# Patient Record
Sex: Female | Born: 1995 | Race: White | Hispanic: Yes | Marital: Single | Smoking: Current every day smoker
Health system: Southern US, Community
[De-identification: ages and names within clinical notes are randomized; demographics above are authoritative.]

---
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
## 2013-05-30 ENCOUNTER — Emergency Department: Payer: Self-pay | Admitting: Emergency Medicine

## 2013-12-12 ENCOUNTER — Emergency Department: Payer: Self-pay | Admitting: Emergency Medicine

## 2013-12-12 LAB — DRUG SCREEN, URINE
Amphetamines, Ur Screen: POSITIVE (ref ?–1000)
BENZODIAZEPINE, UR SCRN: POSITIVE (ref ?–200)
Barbiturates, Ur Screen: NEGATIVE (ref ?–200)
CANNABINOID 50 NG, UR ~~LOC~~: POSITIVE (ref ?–50)
COCAINE METABOLITE, UR ~~LOC~~: POSITIVE (ref ?–300)
MDMA (ECSTASY) UR SCREEN: NEGATIVE (ref ?–500)
METHADONE, UR SCREEN: POSITIVE (ref ?–300)
Opiate, Ur Screen: NEGATIVE (ref ?–300)
Phencyclidine (PCP) Ur S: NEGATIVE (ref ?–25)
Tricyclic, Ur Screen: NEGATIVE (ref ?–1000)

## 2013-12-12 LAB — COMPREHENSIVE METABOLIC PANEL
ALBUMIN: 4 g/dL (ref 3.8–5.6)
Alkaline Phosphatase: 88 U/L
Anion Gap: 8 (ref 7–16)
BILIRUBIN TOTAL: 0.5 mg/dL (ref 0.2–1.0)
BUN: 9 mg/dL (ref 7–18)
CALCIUM: 8.8 mg/dL — AB (ref 9.0–10.7)
CHLORIDE: 105 mmol/L (ref 98–107)
CO2: 24 mmol/L (ref 21–32)
Creatinine: 1.17 mg/dL (ref 0.60–1.30)
EGFR (African American): >60
EGFR (Non-African Amer.): >60
Glucose: 100 mg/dL — ABNORMAL HIGH (ref 65–99)
OSMOLALITY: 273 (ref 275–301)
POTASSIUM: 3.3 mmol/L — AB (ref 3.5–5.1)
SGOT(AST): 32 U/L — ABNORMAL HIGH (ref 0–26)
SGPT (ALT): 15 U/L (ref 12–78)
Sodium: 137 mmol/L (ref 136–145)
Total Protein: 8.5 g/dL (ref 6.4–8.6)

## 2013-12-12 LAB — URINALYSIS, COMPLETE
Bacteria: NONE SEEN
GLUCOSE, UR: NEGATIVE mg/dL (ref 0–75)
Ketone: NEGATIVE
Nitrite: NEGATIVE
Ph: 6 (ref 4.5–8.0)
Protein: 100
Specific Gravity: 1.025 (ref 1.003–1.030)
WBC UR: 41 /HPF (ref 0–5)

## 2013-12-12 LAB — CBC
HCT: 41.7 % (ref 35.0–47.0)
HGB: 14 g/dL (ref 12.0–16.0)
MCH: 30.4 pg (ref 26.0–34.0)
MCHC: 33.5 g/dL (ref 32.0–36.0)
MCV: 91 fL (ref 80–100)
PLATELETS: 292 10*3/uL (ref 150–440)
RBC: 4.59 10*6/uL (ref 3.80–5.20)
RDW: 14.4 % (ref 11.5–14.5)
WBC: 9.1 10*3/uL (ref 3.6–11.0)

## 2013-12-12 LAB — SALICYLATE LEVEL: Salicylates, Serum: 2.3 mg/dL

## 2013-12-12 LAB — ETHANOL
Ethanol %: <0.003 % (ref 0.000–0.080)
Ethanol: <3 mg/dL

## 2013-12-12 LAB — ACETAMINOPHEN LEVEL: Acetaminophen: <2 ug/mL

## 2013-12-14 DIAGNOSIS — M545 Low back pain, unspecified: Secondary | ICD-10-CM | POA: Insufficient documentation

## 2013-12-14 DIAGNOSIS — F419 Anxiety disorder, unspecified: Secondary | ICD-10-CM | POA: Insufficient documentation

## 2014-03-24 ENCOUNTER — Emergency Department: Payer: Self-pay | Admitting: Emergency Medicine

## 2014-03-24 LAB — CBC WITH DIFFERENTIAL/PLATELET
BASOS ABS: 0.1 10*3/uL (ref 0.0–0.1)
Basophil %: 0.7 %
Eosinophil #: 0 10*3/uL (ref 0.0–0.7)
Eosinophil %: 0.1 %
HCT: 44.3 % (ref 35.0–47.0)
HGB: 14.6 g/dL (ref 12.0–16.0)
Lymphocyte #: 1.9 10*3/uL (ref 1.0–3.6)
Lymphocyte %: 19.8 %
MCH: 30.8 pg (ref 26.0–34.0)
MCHC: 33 g/dL (ref 32.0–36.0)
MCV: 93 fL (ref 80–100)
MONO ABS: 0.6 x10 3/mm (ref 0.2–0.9)
Monocyte %: 5.9 %
NEUTROS ABS: 7.1 10*3/uL — AB (ref 1.4–6.5)
Neutrophil %: 73.5 %
PLATELETS: 335 10*3/uL (ref 150–440)
RBC: 4.74 10*6/uL (ref 3.80–5.20)
RDW: 13.6 % (ref 11.5–14.5)
WBC: 9.6 10*3/uL (ref 3.6–11.0)

## 2014-03-24 LAB — COMPREHENSIVE METABOLIC PANEL
ALT: 16 U/L
AST: 25 U/L (ref 15–37)
Albumin: 4.1 g/dL (ref 3.4–5.0)
Alkaline Phosphatase: 72 U/L
Anion Gap: 9 (ref 7–16)
BILIRUBIN TOTAL: 0.3 mg/dL (ref 0.2–1.0)
BUN: 10 mg/dL (ref 7–18)
Calcium, Total: 8.7 mg/dL (ref 8.5–10.1)
Chloride: 108 mmol/L — ABNORMAL HIGH (ref 98–107)
Co2: 24 mmol/L (ref 21–32)
Creatinine: 0.94 mg/dL (ref 0.60–1.30)
EGFR (African American): >60
EGFR (Non-African Amer.): >60
Glucose: 97 mg/dL (ref 65–99)
OSMOLALITY: 280 (ref 275–301)
Potassium: 3.6 mmol/L (ref 3.5–5.1)
SODIUM: 141 mmol/L (ref 136–145)
TOTAL PROTEIN: 8.6 g/dL — AB (ref 6.4–8.2)

## 2014-03-24 LAB — URINALYSIS, COMPLETE
BACTERIA: NONE SEEN
BILIRUBIN, UR: NEGATIVE
Blood: NEGATIVE
GLUCOSE, UR: NEGATIVE mg/dL (ref 0–75)
KETONE: NEGATIVE
NITRITE: NEGATIVE
Ph: 7 (ref 4.5–8.0)
Protein: 100
SPECIFIC GRAVITY: 1.029 (ref 1.003–1.030)
WBC UR: 67 /HPF (ref 0–5)

## 2014-03-24 LAB — LIPASE, BLOOD: Lipase: 144 U/L (ref 73–393)

## 2014-06-22 ENCOUNTER — Emergency Department: Payer: Self-pay | Admitting: Emergency Medicine

## 2014-11-09 NOTE — Op Note (Signed)
PATIENT NAME:  Crystal Ellis, [name] MR#:  [id number] DATE OF BIRTH:  10/14/1995  DATE OF PROCEDURE:  12/14/2012  PREOPERATIVE DIAGNOSIS: Symptomatic right inguinal hernia.   POSTOPERATIVE DIAGNOSIS: Incarcerated right indirect inguinal hernia.   PROCEDURE PERFORMED: Laparoscopic transabdominal herniorrhaphy with Physio mesh.   SURGEON: Raynald KempMark A Glenora Morocho, M.D.   ASSISTANT: Tiney Rougealph Ely, MD  TYPE OF ANESTHESIA:  General endotracheal, 30 mL of 0.25% plain Marcaine.   FINDINGS:  Small indirect inguinal hernia with chronically incarcerated omentum.   SPECIMENS: None.   ESTIMATED BLOOD LOSS: Minimal.   DESCRIPTION OF PROCEDURE: With informed consent, supine position, general oral endotracheal anesthesia, Foley catheter was placed under sterile technique. Both arms were padded and tucked at her side. Her abdomen was widely prepped and draped with ChloraPrep solution. Timeout was observed.   A 12 mm blunt Hassan trocar was placed through an infraumbilical transverse oriented skin incision with stay sutures being passed through the fascia. Pneumoperitoneum was established. The right lower quadrant was inspected. There was a tongue of omentum with an incarcerated indirect inguinal hernia which was easily reduced during the procedure.  Two 5 mm operating trocars were placed laterally at the same level of the umbilical port site. The hernia was then reduced with gentle retraction. The peritoneum was then scored and from medial umbilical ligament medially over the top of the hernia. The hernia sac was dissected free from the inguinal space.  The round ligament was encircled with blunt technique. Epigastric vessels were left in situ. Cooper's ligament was then identified. The Physio mesh was brought onto the field, cut to an oval shape with a slit for the egress of the round ligament and was then inserted into the peritoneal cavity. It was then tacked with the tails medially on Cooper's ligament. ProTack device was  used to secure the mesh laterally and anteriorly. No tacks were placed in the area of the nerve laterally.   The peritoneum was then used to cover the mesh. This was done with the pressure set at 8 mmHg. Ports were then removed under direct visualization. The infraumbilical fascial defect was reapproximated utilizing the existing stay sutures. A total of 30 mL of 0.25% plain Marcaine was infiltrated prior to closure. 4-0 Vicryl subcuticular was applied to all skin edges. The patient was then subsequently extubated and the Foley catheter was removed.  Tolerating the procedure well, the patient was taken to the recovery room in stable and satisfactory condition by anesthesia services.     ____________________________ Redge GainerMark A. Egbert GaribaldiBird, MD mab:ct D: 12/14/2012 10:43:25 ET T: 12/14/2012 10:59:24 ET JOB#: [id number]  cc: Loraine LericheMark A. Egbert GaribaldiBird, MD, <Dictator> Raynald KempMARK A Pranav Lince MD ELECTRONICALLY SIGNED 12/14/2012 12:24
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
## 2014-11-22 ENCOUNTER — Emergency Department
Admission: EM | Admit: 2014-11-22 | Discharge: 2014-11-22 | Discharge status: Against Medical Advice | Payer: Self-pay | Attending: Emergency Medicine | Admitting: Emergency Medicine

## 2014-11-22 ENCOUNTER — Encounter: Payer: Self-pay | Admitting: *Deleted

## 2014-11-22 DIAGNOSIS — R1031 Right lower quadrant pain: Secondary | ICD-10-CM | POA: Insufficient documentation

## 2014-11-22 DIAGNOSIS — R519 Headache, unspecified: Secondary | ICD-10-CM

## 2014-11-22 DIAGNOSIS — R51 Headache: Secondary | ICD-10-CM | POA: Insufficient documentation

## 2014-11-22 DIAGNOSIS — K37 Unspecified appendicitis: Secondary | ICD-10-CM | POA: Insufficient documentation

## 2014-11-22 DIAGNOSIS — Z331 Pregnant state, incidental: Secondary | ICD-10-CM | POA: Insufficient documentation

## 2014-11-22 DIAGNOSIS — N760 Acute vaginitis: Secondary | ICD-10-CM | POA: Insufficient documentation

## 2014-11-22 DIAGNOSIS — Z3201 Encounter for pregnancy test, result positive: Secondary | ICD-10-CM | POA: Insufficient documentation

## 2014-11-22 LAB — URINALYSIS COMPLETE WITH MICROSCOPIC (ARMC ONLY)
Bacteria, UA: NONE SEEN
Bilirubin Urine: NEGATIVE
Glucose, UA: NEGATIVE mg/dL
Ketones, ur: NEGATIVE mg/dL
Leukocytes, UA: NEGATIVE
Nitrite: NEGATIVE
PROTEIN: NEGATIVE mg/dL
Specific Gravity, Urine: 1.021 (ref 1.005–1.030)
pH: 6 (ref 5.0–8.0)

## 2014-11-22 LAB — COMPREHENSIVE METABOLIC PANEL
ALBUMIN: 4.8 g/dL (ref 3.5–5.0)
ALT: 15 U/L (ref 14–54)
AST: 21 U/L (ref 15–41)
Alkaline Phosphatase: 72 U/L (ref 38–126)
Anion gap: 9 (ref 5–15)
BUN: 16 mg/dL (ref 6–20)
CALCIUM: 9.7 mg/dL (ref 8.9–10.3)
CO2: 26 mmol/L (ref 22–32)
Chloride: 103 mmol/L (ref 101–111)
Creatinine, Ser: 0.97 mg/dL (ref 0.44–1.00)
GFR calc Af Amer: >60 mL/min (ref >60)
GFR calc non Af Amer: >60 mL/min (ref >60)
Glucose, Bld: 90 mg/dL (ref 65–99)
Potassium: 4 mmol/L (ref 3.5–5.1)
SODIUM: 138 mmol/L (ref 135–145)
TOTAL PROTEIN: 8.5 g/dL — AB (ref 6.5–8.1)
Total Bilirubin: 0.7 mg/dL (ref 0.3–1.2)

## 2014-11-22 LAB — CBC WITH DIFFERENTIAL/PLATELET
BASOS ABS: 0.1 10*3/uL (ref 0–0.1)
Basophils Relative: 0 %
EOS ABS: 0.1 10*3/uL (ref 0–0.7)
EOS PCT: 1 %
HCT: 46 % (ref 35.0–47.0)
Hemoglobin: 15.1 g/dL (ref 12.0–16.0)
Lymphocytes Relative: 27 %
Lymphs Abs: 3.3 10*3/uL (ref 1.0–3.6)
MCH: 30.5 pg (ref 26.0–34.0)
MCHC: 32.9 g/dL (ref 32.0–36.0)
MCV: 92.5 fL (ref 80.0–100.0)
MONO ABS: 0.8 10*3/uL (ref 0.2–0.9)
Monocytes Relative: 7 %
Neutro Abs: 7.9 10*3/uL — ABNORMAL HIGH (ref 1.4–6.5)
Neutrophils Relative %: 65 %
Platelets: 334 10*3/uL (ref 150–440)
RBC: 4.97 MIL/uL (ref 3.80–5.20)
RDW: 13.1 % (ref 11.5–14.5)
WBC: 12.1 10*3/uL — AB (ref 3.6–11.0)

## 2014-11-22 LAB — PREGNANCY, URINE: Preg Test, Ur: NEGATIVE

## 2014-11-22 LAB — WET PREP, GENITAL
Trich, Wet Prep: NONE SEEN
YEAST WET PREP: NONE SEEN

## 2014-11-22 LAB — CHLAMYDIA/NGC RT PCR (ARMC ONLY)
CHLAMYDIA TR: NOT DETECTED
N GONORRHOEAE: NOT DETECTED

## 2014-11-22 LAB — POCT PREGNANCY, URINE: Preg Test, Ur: POSITIVE — AB

## 2014-11-22 MED ORDER — PROCHLORPERAZINE EDISYLATE 5 MG/ML IJ SOLN
INTRAMUSCULAR | Status: AC
  Administered 2014-11-22: 10 mg via INTRAVENOUS
  Filled 2014-11-22: qty 2

## 2014-11-22 MED ORDER — SODIUM CHLORIDE 0.9 % IV BOLUS (SEPSIS)
1000.0000 mL | Freq: Once | INTRAVENOUS | Status: AC
  Administered 2014-11-22: 1000 mL via INTRAVENOUS

## 2014-11-22 MED ORDER — PROCHLORPERAZINE EDISYLATE 5 MG/ML IJ SOLN
10.0000 mg | Freq: Once | INTRAMUSCULAR | Status: AC
  Administered 2014-11-22: 10 mg via INTRAVENOUS
  Filled 2014-11-22: qty 2

## 2014-11-22 MED ORDER — IOHEXOL 240 MG/ML SOLN
25.0000 mL | Freq: Once | INTRAMUSCULAR | Status: AC | PRN
  Administered 2014-11-22: 25 mL via ORAL
  Filled 2014-11-22: qty 25

## 2014-11-22 NOTE — ED Provider Notes (Signed)
[facility] Emergency Department Provider Note     ____________________________________________  Time seen: 7 PM  I have reviewed the triage vital signs and the nursing notes.   HISTORY  Chief Complaint Abdominal Pain       HPI Crystal Ellis is a 19 y.o. female without any medical history presents today with right lower quadrant abdominal pain and a headache for the last 3 days. He says the right lower quadrant pain has been coming and going and is a 6 out of 10 at this time. There is no associated nausea vomiting diarrhea vaginal discharge or bleeding. She denies any pain with urination as well. She is also having a posterior headache which was gradual onset and started 3 days ago. She has taken multiple by mouth medications for this or last 3 days without any relief. It was gradual in onset without any thunderclap character to it. It is throbbing in character now and is worsened by bright lights. Denies any fever or neck stiffness. Food does not exacerbate abdominal pain. Also is concerned because is not a period since March. Has had a home negative pregnancy test.     History reviewed. No pertinent past medical history.  There are no active problems to display for this patient.   Past Surgical History  Procedure Laterality Date  . Cesarean section      No current outpatient prescriptions on file.  Allergies Review of patient's allergies indicates no known allergies.  No family history on file.  Social History History  Substance Use Topics  . Smoking status: Never Smoker   . Smokeless tobacco: Not on file  . Alcohol Use: No    Review of Systems  Constitutional: Negative for fever. Eyes: Negative for visual changes. ENT: Negative for sore throat. Cardiovascular: Negative for chest pain. Respiratory: Negative for shortness of breath. Gastrointestinal: Right lower quadrant abdominal pain. Negative for, vomiting and  diarrhea. Genitourinary: Negative for dysuria. Musculoskeletal: Negative for back pain. Skin: Negative for rash. Neurological: Throbbing posterior headache for 3 days.   10-point ROS otherwise negative.  ____________________________________________   PHYSICAL EXAM:  VITAL SIGNS: ED Triage Vitals  Enc Vitals Group     BP 11/22/14 1809 123/83 mmHg     Pulse Rate 11/22/14 1805 100     Resp 11/22/14 1805 18     Temp 11/22/14 1805 98.1 F (36.7 C)     Temp Source 11/22/14 1805 Oral     SpO2 11/22/14 1805 100 %     Weight 11/22/14 1805 160 lb (72.576 kg)     Height 11/22/14 1805 5\' 2"  (1.575 m)     Head Cir --      Peak Flow --      Pain Score 11/22/14 1805 7     Pain Loc --      Pain Edu? --      Excl. in GC? --      Constitutional: Alert and oriented. Well appearing and in no distress. Eyes: Conjunctivae are normal. PERRL. Normal extraocular movements. ENT   Head: Normocephalic and atraumatic.   Nose: No congestion/rhinnorhea.   Mouth/Throat: Mucous membranes are moist.   Neck: No stridor. Hematological/Lymphatic/Immunilogical: No cervical lymphadenopathy. Cardiovascular: Normal rate, regular rhythm. Normal and symmetric distal pulses are present in all extremities. No murmurs, rubs, or gallops. Respiratory: Normal respiratory effort without tachypnea nor retractions. Breath sounds are clear and equal bilaterally. No wheezes/rales/rhonchi. Gastrointestinal: Soft and tender to the right lower quadrant over McBurney's point.Marland Kitchen. No  distention. No abdominal bruits. There is no CVA tenderness. Genitourinary: Normal external exam. On exam with moderate amount of white to yellow discharge. Normal bimanual exam without any CMT uterine or adnexal tenderness palpation. Musculoskeletal: Nontender with normal range of motion in all extremities. No joint effusions.  No lower extremity tenderness nor edema. Neurologic:  Normal speech and language. No gross focal neurologic  deficits are appreciated. Speech is normal. No gait instability. Skin:  Skin is warm, dry and intact. No rash noted. Psychiatric: Mood and affect are normal. Speech and behavior are normal. Patient exhibits appropriate insight and judgment.  ____________________________________________    LABS (pertinent positives/negatives)  White blood cell count. Few clue cells on her wet mount. Moderate WBC.  ____________________________________________   EKG    ____________________________________________    RADIOLOGY  Patient eloped prior to CAT scan being completed.  ____________________________________________   PROCEDURES    ____________________________________________   INITIAL IMPRESSION / ASSESSMENT AND PLAN / ED COURSE  Pertinent labs & imaging results that were available during my care of the patient were reviewed by me and considered in my medical decision making (see chart for details).  ----------------------------------------- 8:54 PM on 11/22/2014 -----------------------------------------  Patient eloped. Telephone IV and left the emergency department. Patient contacted on telephone at 8:54 PM says that she had to leave due to an emergency with her child. I discussed with the patient that I was concerned for appendicitis which if not treated quickly can progress to complications in her belly resulting in multiple competitions including surgeries and death.  I also discussed the results of the wet mount. The patient was advised to come back as soon as possible for completion of her workup. She said that she will try to come back in the morning. The patient had capacity prior to leaving. Was not clinically intoxicated or acting erratically. ____________________________________________   FINAL CLINICAL IMPRESSION(S) / ED DIAGNOSES  Right lower quadrant abdominal pain, cannot rule out appendicitis. Bacterial vaginosis. Acute, initial visit.   Arelia Longestavid M Schaevitz,  MD 11/22/14 2055

## 2014-11-22 NOTE — ED Notes (Addendum)
Went in to pt room, iv was out, lying on the floor and IVF leaking out on the bed. EDP notified of the same. Not able to discuss AMA information with pt prior to leaving as pt left without the knowledge of staff.

## 2014-11-22 NOTE — ED Notes (Signed)
Pt reports abdominal cramping that started yesterday and a headache for 3 days. Pt also states she has not had a period since March, has had negative pregnancy test

## 2016-03-23 ENCOUNTER — Emergency Department
Admission: EM | Admit: 2016-03-23 | Discharge: 2016-03-23 | Disposition: A | Discharge status: Home / Self Care | Payer: Self-pay | Attending: Emergency Medicine | Admitting: Emergency Medicine

## 2016-03-23 DIAGNOSIS — R102 Pelvic and perineal pain: Secondary | ICD-10-CM

## 2016-03-23 DIAGNOSIS — F17200 Nicotine dependence, unspecified, uncomplicated: Secondary | ICD-10-CM | POA: Insufficient documentation

## 2016-03-23 LAB — COMPREHENSIVE METABOLIC PANEL
ALK PHOS: 57 U/L (ref 38–126)
ALT: 13 U/L — AB (ref 14–54)
AST: 18 U/L (ref 15–41)
Albumin: 4.5 g/dL (ref 3.5–5.0)
Anion gap: 6 (ref 5–15)
BUN: 11 mg/dL (ref 6–20)
CALCIUM: 9.2 mg/dL (ref 8.9–10.3)
CO2: 25 mmol/L (ref 22–32)
CREATININE: 0.89 mg/dL (ref 0.44–1.00)
Chloride: 104 mmol/L (ref 101–111)
GFR calc non Af Amer: >60 mL/min (ref >60)
GLUCOSE: 100 mg/dL — AB (ref 65–99)
Potassium: 3.8 mmol/L (ref 3.5–5.1)
Sodium: 135 mmol/L (ref 135–145)
Total Bilirubin: 0.7 mg/dL (ref 0.3–1.2)
Total Protein: 7.7 g/dL (ref 6.5–8.1)

## 2016-03-23 LAB — URINALYSIS COMPLETE WITH MICROSCOPIC (ARMC ONLY)
BILIRUBIN URINE: NEGATIVE
Bacteria, UA: NONE SEEN
GLUCOSE, UA: NEGATIVE mg/dL
HGB URINE DIPSTICK: NEGATIVE
KETONES UR: NEGATIVE mg/dL
Leukocytes, UA: NEGATIVE
NITRITE: NEGATIVE
Protein, ur: NEGATIVE mg/dL
SPECIFIC GRAVITY, URINE: 1.014 (ref 1.005–1.030)
WBC, UA: NONE SEEN WBC/hpf (ref 0–5)
pH: 8 (ref 5.0–8.0)

## 2016-03-23 LAB — CBC
HCT: 43.5 % (ref 35.0–47.0)
Hemoglobin: 14.8 g/dL (ref 12.0–16.0)
MCH: 31 pg (ref 26.0–34.0)
MCHC: 34.1 g/dL (ref 32.0–36.0)
MCV: 91 fL (ref 80.0–100.0)
PLATELETS: 339 10*3/uL (ref 150–440)
RBC: 4.78 MIL/uL (ref 3.80–5.20)
RDW: 13.5 % (ref 11.5–14.5)
WBC: 7.9 10*3/uL (ref 3.6–11.0)

## 2016-03-23 LAB — POCT PREGNANCY, URINE: Preg Test, Ur: NEGATIVE

## 2016-03-23 LAB — LIPASE, BLOOD: Lipase: 33 U/L (ref 11–51)

## 2016-03-23 NOTE — ED Provider Notes (Signed)
Time Seen: Approximately1341  I have reviewed the triage notes  Chief Complaint: Abdominal Pain   History of Present Illness: Crystal Ellis is a 20 y.o. female who states that she's been traveling with her family. She was seen and evaluated and a outside medical facility and was told that she had a urinary tract infection and her urine pregnancy test was positive. Patient states she's had normal menstrual periods since that time in July and August. She states that she is due any day now for her menstrual period. Describes lower middle quadrant abdominal cramping. She denies any persistent nausea vomiting or significant vaginal bleeding. She denies any abnormal vaginal discharge, back or flank pain.   History reviewed. No pertinent past medical history.  There are no active problems to display for this patient.   Past Surgical History:  Procedure Laterality Date  . CESAREAN SECTION      Past Surgical History:  Procedure Laterality Date  . CESAREAN SECTION        Allergies:  Acetaminophen and Lavender oil  Family History: History reviewed. No pertinent family history.  Social History: Social History  Substance Use Topics  . Smoking status: Light Tobacco Smoker  . Smokeless tobacco: Never Used     Comment: occasional  . Alcohol use No     Review of Systems:   10 point review of systems was performed and was otherwise negative:  Constitutional: No fever Eyes: No visual disturbances ENT: No sore throat, ear pain Cardiac: No chest pain Respiratory: No shortness of breath, wheezing, or stridor Abdomen: Crampy middle lower quadrant abdominal pain Endocrine: No weight loss, No night sweats Extremities: No peripheral edema, cyanosis Skin: No rashes, easy bruising Neurologic: No focal weakness, trouble with speech or swollowing Urologic: No dysuria, Hematuria, or urinary frequency *  Physical Exam:  ED Triage Vitals  Enc Vitals Group     BP 03/23/16 1247  122/77     Pulse Rate 03/23/16 1247 83     Resp 03/23/16 1247 16     Temp 03/23/16 1247 98 F (36.7 C)     Temp Source 03/23/16 1247 Oral     SpO2 03/23/16 1247 100 %     Weight 03/23/16 1248 137 lb (62.1 kg)     Height 03/23/16 1248 5\' 2"  (1.575 m)     Head Circumference --      Peak Flow --      Pain Score 03/23/16 1248 4     Pain Loc --      Pain Edu? --      Excl. in GC? --     General: Awake , Alert , and Oriented times 3; GCS 15 Head: Normal cephalic , atraumatic Eyes: Pupils equal , round, reactive to light Nose/Throat: No nasal drainage, patent upper airway without erythema or exudate.  Neck: Supple, Full range of motion, No anterior adenopathy or palpable thyroid masses Lungs: Clear to ascultation without wheezes , rhonchi, or rales Heart: Regular rate, regular rhythm without murmurs , gallops , or rubs Abdomen: Soft, non tender without rebound, guarding , or rigidity; bowel sounds positive and symmetric in all 4 quadrants. No organomegaly .        Extremities: 2 plus symmetric pulses. No edema, clubbing or cyanosis Neurologic: normal ambulation, Motor symmetric without deficits, sensory intact Skin: warm, dry, no rashes   Labs:   All laboratory work was reviewed including any pertinent negatives or positives listed below:  Labs Reviewed  COMPREHENSIVE METABOLIC PANEL -  Abnormal; Notable for the following:       Result Value   Glucose, Bld 100 (*)    ALT 13 (*)    All other components within normal limits  URINALYSIS COMPLETEWITH MICROSCOPIC ([facility] ONLY) - Abnormal; Notable for the following:    Color, Urine YELLOW (*)    APPearance CLEAR (*)    Squamous Epithelial / LPF 0-5 (*)    All other components within normal limits  LIPASE, BLOOD  CBC  POC URINE PREG, ED  POCT PREGNANCY, URINE   Laboratory work was reviewed and showed no clinically significant abnormalities.   ED Course:  Patient's physical exam shows no peritoneal signs and we discussed her  laboratory work and I think her main concern was whether or not she was pregnant. Given a negative urine pregnancy test and felt we could stop the testing at that point. I felt radiologic studies were not necessary. To take over-the-counter ibuprofen and/or Tylenol for pain as this may be early menstrual cramping the patient apparently is left prior to receiving her paperwork. I certainly felt there was no surgical issue at this time Clinical Course     Assessment:  Acute unspecified lower abdominal pain in female   Final Clinical Impression:   Final diagnoses:  Pelvic pain in female     Plan:  Outpatient Patient was advised to return immediately if condition worsens. Patient was advised to follow up with their primary care physician or other specialized physicians involved in their outpatient care. The patient and/or family member/power of attorney had laboratory results reviewed at the bedside. All questions and concerns were addressed and appropriate discharge instructions were distributed by the nursing staff.            Jennye MoccasinBrian S Shaughnessy Gethers, MD 03/23/16 [phone]

## 2016-03-23 NOTE — Discharge Instructions (Signed)
Return to emergency department if condition worsens. Return here especially for fever, isolated right lower quadrant abdominal pain, or any other new concerns. Please take over-the-counter ibuprofen for pain.  Please return immediately if condition worsens. Please contact her primary physician or the physician you were given for referral. If you have any specialist physicians involved in her treatment and plan please also contact them. Thank you for using  regional emergency Department.

## 2016-03-23 NOTE — ED Notes (Signed)
Went into pt's room with discharge papers and follow-up care instructions. Pt not in room. No belongings left behind. Checked lobby for pt but did not see her.

## 2016-03-23 NOTE — ED Triage Notes (Signed)
Pt c/o lower abd cramping intermittent for over a week and states she had confirmed pregnancy while traveling in [place] June 10th. States she has been having normal menstrual periods, last was august 3rd.

## 2016-03-23 NOTE — ED Triage Notes (Signed)
Pt comes in to ED w/ c/o lower abdominal pain x2 days ago. Denies NVD. Pt AOx4. NAD in triage.

## 2017-05-05 ENCOUNTER — Ambulatory Visit
Admission: EM | Admit: 2017-05-05 | Discharge: 2017-05-05 | Disposition: A | Discharge status: Home / Self Care | Payer: Medicaid Other | Attending: Family Medicine | Admitting: Family Medicine

## 2017-05-05 DIAGNOSIS — S0502XA Injury of conjunctiva and corneal abrasion without foreign body, left eye, initial encounter: Secondary | ICD-10-CM

## 2017-05-05 MED ORDER — MOXIFLOXACIN HCL 0.5 % OP SOLN: 1.0000 [drp] | Freq: Three times a day (TID) | OPHTHALMIC | 0 refills | Status: DC

## 2017-05-05 NOTE — ED Provider Notes (Signed)
[facility]    CSN: [id number] Arrival date & time: 05/05/17  1240     History   Chief Complaint Chief Complaint  Patient presents with  . Eye Injury    HPI Crystal Ellis is a 21 y.o. female.   21 yo female with a c/o left eye redness, watering and discomfort for 2 days, then injuring it. States she was cutting the plastic tip of a glue container and it flew and hit her in the left eye. This occurred 2 days ago.    The history is provided by the patient.    History reviewed. No pertinent past medical history.  There are no active problems to display for this patient.   Past Surgical History:  Procedure Laterality Date  . CESAREAN SECTION      OB History    No data available       Home Medications    Prior to Admission medications   Medication Sig Start Date End Date Taking? Authorizing Provider  moxifloxacin (VIGAMOX) 0.5 % ophthalmic solution Place 1 drop into the left eye 3 (three) times daily. 05/05/17   Payton Mccallumonty, Alliyah Roesler, MD    Family History History reviewed. No pertinent family history.  Social History Social History  Substance Use Topics  . Smoking status: Current Every Day Smoker    Packs/day: 0.50    Types: Cigarettes  . Smokeless tobacco: Never Used     Comment: occasional  . Alcohol use No     Allergies   Lavender oil   Review of Systems Review of Systems   Physical Exam Triage Vital Signs ED Triage Vitals  Enc Vitals Group     BP --      Pulse --      Resp --      Temp --      Temp src --      SpO2 --      Weight 05/05/17 1306 135 lb (61.2 kg)     Height 05/05/17 1306 5\' 2"  (1.575 m)     Head Circumference --      Peak Flow --      Pain Score 05/05/17 1307 8     Pain Loc --      Pain Edu? --      Excl. in GC? --    No data found.   Updated Vital Signs BP 123/68 (BP Location: Left Arm)   Pulse (!) 101   Temp 98.4 F (36.9 C) (Oral)   Resp 18   Ht 5\' 2"  (1.575 m)   Wt 135 lb (61.2 kg)   LMP  04/25/2017   SpO2 96%   BMI 24.69 kg/m   Visual Acuity Right Eye Distance:   Left Eye Distance:   Bilateral Distance:    Right Eye Near:   Left Eye Near:    Bilateral Near:     Physical Exam  Constitutional: She appears well-developed and well-nourished. No distress.  Eyes: Pupils are equal, round, and reactive to light. EOM and lids are normal. Lids are everted and swept, no foreign bodies found. Right eye exhibits no discharge and no exudate. No foreign body present in the right eye. Left eye exhibits no discharge and no exudate. No foreign body present in the left eye. Right conjunctiva is injected.  Slit lamp exam:      The right eye shows no corneal abrasion, no corneal flare, no corneal ulcer, no foreign body, no hyphema, no hypopyon, no fluorescein uptake  and no anterior chamber bulge.       The left eye shows corneal abrasion and fluorescein uptake. The left eye shows no corneal flare, no corneal ulcer, no foreign body, no hyphema, no hypopyon and no anterior chamber bulge.    Skin: She is not diaphoretic.  Nursing note and vitals reviewed.    UC Treatments / Results  Labs (all labs ordered are listed, but only abnormal results are displayed) Labs Reviewed - No data to display  EKG  EKG Interpretation None       Radiology No results found.  Procedures Procedures (including critical care time)  Medications Ordered in UC Medications - No data to display   Initial Impression / Assessment and Plan / UC Course  I have reviewed the triage vital signs and the nursing notes.  Pertinent labs & imaging results that were available during my care of the patient were reviewed by me and considered in my medical decision making (see chart for details).       Final Clinical Impressions(s) / UC Diagnoses   Final diagnoses:  Abrasion of left cornea, initial encounter    New Prescriptions Discharge Medication List as of 05/05/2017  1:35 PM    START taking these  medications   Details  moxifloxacin (VIGAMOX) 0.5 % ophthalmic solution Place 1 drop into the left eye 3 (three) times daily., Starting Wed 05/05/2017, Normal       1. diagnosis reviewed with patient 2. rx as per orders above; reviewed possible side effects, interactions, risks and benefits  3. Recommend supportive treatment with otc analgesics 4. Recommend follow up with eye doctor specialist in 2 days for re-evaluation; stressed with patient the importance of close follow up with the eye specialist  5. Follow up here prn  Controlled Substance Prescriptions [place] Controlled Substance Registry consulted? Not Applicable   Payton Mccallum, MD 05/05/17 [phone]

## 2017-05-05 NOTE — ED Triage Notes (Signed)
Pt cut a plastic tip off of glue container and it flew and hit her in the left eye. Pain 8/10. Eye has been watering. Pt needs glasses and unable to read the eyechart.

## 2017-05-05 NOTE — Discharge Instructions (Signed)
Follow up with eye doctor specialist in 2 days

## 2019-01-09 ENCOUNTER — Emergency Department
Admission: EM | Admit: 2019-01-09 | Discharge: 2019-01-09 | Disposition: A | Discharge status: Home / Self Care | Payer: No Typology Code available for payment source | Attending: Emergency Medicine | Admitting: Emergency Medicine

## 2019-01-09 ENCOUNTER — Encounter: Payer: Self-pay | Admitting: Medical Oncology

## 2019-01-09 DIAGNOSIS — T424X1A Poisoning by benzodiazepines, accidental (unintentional), initial encounter: Secondary | ICD-10-CM | POA: Diagnosis not present

## 2019-01-09 DIAGNOSIS — T50901A Poisoning by unspecified drugs, medicaments and biological substances, accidental (unintentional), initial encounter: Secondary | ICD-10-CM

## 2019-01-09 DIAGNOSIS — F17210 Nicotine dependence, cigarettes, uncomplicated: Secondary | ICD-10-CM | POA: Diagnosis not present

## 2019-01-09 DIAGNOSIS — T507X1A Poisoning by analeptics and opioid receptor antagonists, accidental (unintentional), initial encounter: Secondary | ICD-10-CM | POA: Diagnosis not present

## 2019-01-09 DIAGNOSIS — R410 Disorientation, unspecified: Secondary | ICD-10-CM | POA: Diagnosis present

## 2019-01-09 LAB — GLUCOSE, CAPILLARY: Glucose-Capillary: 78 mg/dL (ref 70–99)

## 2019-01-09 MED ORDER — NALOXONE HCL 4 MG/0.1ML NA LIQD: NASAL | 0 refills | Status: DC
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
## 2019-01-09 NOTE — ED Notes (Signed)
Patient is talking on the phone at this time, no signs of distress. Nurse will continue to monitor.
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
## 2019-01-09 NOTE — ED Notes (Signed)
Patient with discharge instructions, no signs of distress, voices understanding of discharge instructions, all belongings with her.

## 2019-03-02 ENCOUNTER — Emergency Department
Admission: EM | Admit: 2019-03-02 | Discharge: 2019-03-02 | Disposition: A | Discharge status: Home / Self Care | Payer: Self-pay | Attending: Emergency Medicine | Admitting: Emergency Medicine

## 2019-03-02 ENCOUNTER — Other Ambulatory Visit: Payer: Self-pay

## 2019-03-02 ENCOUNTER — Emergency Department: Payer: Self-pay

## 2019-03-02 DIAGNOSIS — T401X1A Poisoning by heroin, accidental (unintentional), initial encounter: Secondary | ICD-10-CM | POA: Insufficient documentation

## 2019-03-02 DIAGNOSIS — F17210 Nicotine dependence, cigarettes, uncomplicated: Secondary | ICD-10-CM | POA: Insufficient documentation

## 2019-03-02 DIAGNOSIS — F1910 Other psychoactive substance abuse, uncomplicated: Secondary | ICD-10-CM | POA: Insufficient documentation

## 2019-03-02 DIAGNOSIS — F1510 Other stimulant abuse, uncomplicated: Secondary | ICD-10-CM | POA: Insufficient documentation

## 2019-03-02 LAB — URINALYSIS, COMPLETE (UACMP) WITH MICROSCOPIC
Bacteria, UA: NONE SEEN
Bilirubin Urine: NEGATIVE
Glucose, UA: NEGATIVE mg/dL
Ketones, ur: NEGATIVE mg/dL
Leukocytes,Ua: NEGATIVE
Nitrite: NEGATIVE
Protein, ur: NEGATIVE mg/dL
Specific Gravity, Urine: 1.015 (ref 1.005–1.030)
pH: 6 (ref 5.0–8.0)

## 2019-03-02 LAB — CBC WITH DIFFERENTIAL/PLATELET
Abs Immature Granulocytes: 0.01 10*3/uL (ref 0.00–0.07)
Basophils Absolute: 0.1 10*3/uL (ref 0.0–0.1)
Basophils Relative: 1 %
Eosinophils Absolute: 0.1 10*3/uL (ref 0.0–0.5)
Eosinophils Relative: 1 %
HCT: 36.7 % (ref 36.0–46.0)
Hemoglobin: 12.1 g/dL (ref 12.0–15.0)
Immature Granulocytes: 0 %
Lymphocytes Relative: 43 %
Lymphs Abs: 2.4 10*3/uL (ref 0.7–4.0)
MCH: 28.7 pg (ref 26.0–34.0)
MCHC: 33 g/dL (ref 30.0–36.0)
MCV: 87.2 fL (ref 80.0–100.0)
Monocytes Absolute: 0.6 10*3/uL (ref 0.1–1.0)
Monocytes Relative: 11 %
Neutro Abs: 2.4 10*3/uL (ref 1.7–7.7)
Neutrophils Relative %: 44 %
Platelets: 279 10*3/uL (ref 150–400)
RBC: 4.21 MIL/uL (ref 3.87–5.11)
RDW: 13.2 % (ref 11.5–15.5)
WBC: 5.6 10*3/uL (ref 4.0–10.5)
nRBC: 0 % (ref 0.0–0.2)

## 2019-03-02 LAB — URINE DRUG SCREEN, QUALITATIVE (ARMC ONLY)
Amphetamines, Ur Screen: POSITIVE — AB
Barbiturates, Ur Screen: NOT DETECTED
Benzodiazepine, Ur Scrn: POSITIVE — AB
Cannabinoid 50 Ng, Ur ~~LOC~~: POSITIVE — AB
Cocaine Metabolite,Ur ~~LOC~~: NOT DETECTED
MDMA (Ecstasy)Ur Screen: NOT DETECTED
Methadone Scn, Ur: NOT DETECTED
Opiate, Ur Screen: POSITIVE — AB
Phencyclidine (PCP) Ur S: NOT DETECTED
Tricyclic, Ur Screen: NOT DETECTED

## 2019-03-02 LAB — COMPREHENSIVE METABOLIC PANEL
ALT: 11 U/L (ref 0–44)
AST: 19 U/L (ref 15–41)
Albumin: 4 g/dL (ref 3.5–5.0)
Alkaline Phosphatase: 53 U/L (ref 38–126)
Anion gap: 5 (ref 5–15)
BUN: 8 mg/dL (ref 6–20)
CO2: 25 mmol/L (ref 22–32)
Calcium: 8.8 mg/dL — ABNORMAL LOW (ref 8.9–10.3)
Chloride: 107 mmol/L (ref 98–111)
Creatinine, Ser: 0.84 mg/dL (ref 0.44–1.00)
GFR calc Af Amer: >60 mL/min (ref >60)
GFR calc non Af Amer: >60 mL/min (ref >60)
Glucose, Bld: 98 mg/dL (ref 70–99)
Potassium: 3.5 mmol/L (ref 3.5–5.1)
Sodium: 137 mmol/L (ref 135–145)
Total Bilirubin: 0.3 mg/dL (ref 0.3–1.2)
Total Protein: 7.4 g/dL (ref 6.5–8.1)

## 2019-03-02 LAB — ETHANOL: Alcohol, Ethyl (B): <10 mg/dL (ref <10)

## 2019-03-02 MED ORDER — SODIUM CHLORIDE 0.9 % IV BOLUS
1000.0000 mL | Freq: Once | INTRAVENOUS | Status: AC
  Administered 2019-03-02: 04:00:00 1000 mL via INTRAVENOUS
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
## 2019-03-02 NOTE — Discharge Instructions (Signed)
Return to the ER for worsening symptoms, persistent vomiting, difficulty breathing or other concerns. °

## 2019-11-14 ENCOUNTER — Emergency Department
Admission: EM | Admit: 2019-11-14 | Discharge: 2019-11-15 | Disposition: A | Discharge status: Home / Self Care | Payer: Self-pay | Attending: Emergency Medicine | Admitting: Emergency Medicine

## 2019-11-14 ENCOUNTER — Encounter: Payer: Self-pay | Admitting: Medical Oncology

## 2019-11-14 ENCOUNTER — Other Ambulatory Visit: Payer: Self-pay

## 2019-11-14 DIAGNOSIS — F17210 Nicotine dependence, cigarettes, uncomplicated: Secondary | ICD-10-CM | POA: Insufficient documentation

## 2019-11-14 DIAGNOSIS — T401X1A Poisoning by heroin, accidental (unintentional), initial encounter: Secondary | ICD-10-CM | POA: Insufficient documentation

## 2019-11-14 MED ORDER — NALOXONE HCL 2 MG/2ML IJ SOSY
PREFILLED_SYRINGE | INTRAMUSCULAR | Status: AC
  Administered 2019-11-14: 14:00:00 0.5 mg via INTRAVENOUS
  Filled 2019-11-14: qty 2

## 2019-11-14 MED ORDER — NALOXONE HCL 2 MG/2ML IJ SOSY
PREFILLED_SYRINGE | INTRAMUSCULAR | Status: AC
  Filled 2019-11-14: qty 2

## 2019-11-14 MED ORDER — NALOXONE HCL 0.4 MG/0.4ML IJ SOAJ: INTRAMUSCULAR | 1 refills | Status: AC

## 2019-11-14 MED ORDER — SODIUM CHLORIDE 0.9 % IV BOLUS: 1000.0000 mL | Freq: Once | INTRAVENOUS | Status: DC

## 2019-11-14 MED ORDER — NALOXONE HCL 2 MG/2ML IJ SOSY: 0.5000 mg | PREFILLED_SYRINGE | Freq: Once | INTRAMUSCULAR | Status: AC

## 2019-11-14 NOTE — ED Provider Notes (Signed)
Assumed care from Dr. Lenard Lance at 3 PM. Briefly, the patient is a 24 y.o. female with PMHx of  has no past medical history on file. here with accidental opioid overdose. S/p narcan. Protecting airway here, plan to monitor for sobriety. Has not needed additional dosing. OD was not intentional, no SI, HI, AVH.   Labs Reviewed - No data to display  Course of Care: -Patient increasingly awake and alert, in NAD. Will PO challenge, ambulate.       Shaune Pollack, MD 11/15/19 0030

## 2019-11-14 NOTE — ED Provider Notes (Signed)
[facility] Emergency Department Provider Note  Time seen: 2:41 PM  I have reviewed the triage vital signs and the nursing notes.   HISTORY  Chief Complaint Loss of Consciousness   HPI Crystal Ellis is a 24 y.o. female with a past medical history of substance abuse presents to the emergency department unresponsive.  According EMS they were called out to the patient for unresponsiveness.  Patient was at a friend's house, admits to heroin use.  Patient states she was snorting heroin.  EMS states upon arrival patient was unresponsive with agonal type respirations was given IM Narcan by the friend prior to EMS arrival was given intranasal Narcan by EMS with some improvement patient was extremely somnolent still upon arrival to the emergency department.  Patient was given 0.5 mg of IV Narcan upon arrival with immediate improvement.  Patient denies any pain.   History reviewed. No pertinent past medical history.  There are no problems to display for this patient.   Past Surgical History:  Procedure Laterality Date  . CESAREAN SECTION      Prior to Admission medications   Medication Sig Start Date End Date Taking? Authorizing Provider  loratadine (CLARITIN) 10 MG tablet Take 10 mg by mouth daily as needed for allergies.    [provider]  naloxone [facility]) nasal spray 4 mg/0.1 mL As directed on package as needed for opioid overdose 01/09/19   Shaune Pollack, MD    Allergies  Allergen Reactions  . Lavender Oil Other (See Comments)    allergies    No family history on file.  Social History Social History   Tobacco Use  . Smoking status: Current Every Day Smoker    Packs/day: 0.50    Types: Cigarettes  . Smokeless tobacco: Never Used  . Tobacco comment: occasional  Substance Use Topics  . Alcohol use: No  . Drug use: Yes    Types: IV, Methamphetamines    Review of Systems Constitutional: Negative for fever. Cardiovascular: Negative  for chest pain. Respiratory: Negative for shortness of breath. Gastrointestinal: Negative for abdominal pain Musculoskeletal: Negative for musculoskeletal complaints Neurological: Negative for headache All other ROS negative  ____________________________________________   PHYSICAL EXAM:  VITAL SIGNS: ED Triage Vitals  Enc Vitals Group     BP 11/14/19 1340 (!) 121/92     Pulse Rate 11/14/19 1340 72     Resp 11/14/19 1340 10     Temp 11/14/19 1340 97.7 F (36.5 C)     Temp Source 11/14/19 1340 Oral     SpO2 11/14/19 1340 100 %     Weight 11/14/19 1341 134 lb 7.7 oz (61 kg)     Height 11/14/19 1341 5\' 2"  (1.575 m)     Head Circumference --      Peak Flow --      Pain Score 11/14/19 1341 0     Pain Loc --      Pain Edu? --      Excl. in GC? --    Constitutional: Patient initially very somnolent, unable to maintain a conversation without falling asleep.  After Narcan patient is awake alert oriented x4, answering questions appropriately and following commands. Eyes: Normal exam ENT      Head: Normocephalic and atraumatic.      Mouth/Throat: Mucous membranes are moist. Cardiovascular: Normal rate, regular rhythm.  Respiratory: Normal respiratory effort without tachypnea nor retractions. Breath sounds are clear  Gastrointestinal: Soft and nontender. No distention.  Musculoskeletal: Nontender with normal  range of motion in all extremities.  Neurologic:  Normal speech and language. No gross focal neurologic deficits are appreciated. Skin:  Skin is warm, dry and intact.  Psychiatric: Mood and affect are normal.   ____________________________________________    EKG  EKG viewed and interpreted by myself shows a normal sinus rhythm at 78 bpm with a narrow QRS, normal axis, normal intervals, no concerning ST changes.  ____________________________________________   INITIAL IMPRESSION / ASSESSMENT AND PLAN / ED COURSE  Pertinent labs & imaging results that were available during  my care of the patient were reviewed by me and considered in my medical decision making (see chart for details).   Patient presents to the emergency department after admitted heroin use.  Found unresponsive, had some response to Narcan intranasally by EMS, had significant response to Narcan in the emergency department via IV.  Patient is now awake alert oriented, able to answer questions appropriate follow commands.  Denies any pain, admits to snorting heroin.  Patient appears well.  We will continue to closely monitor in the emergency department.  As long as the patient continues to appear well anticipate likely discharge home.  We will provide follow-up resources for the patient if she is interested in rehab/detox.  Crystal Ellis was evaluated in Emergency Department on 11/14/2019 for the symptoms described in the history of present illness. She was evaluated in the context of the global COVID-19 pandemic, which necessitated consideration that the patient might be at risk for infection with the SARS-CoV-2 virus that causes COVID-19. Institutional protocols and algorithms that pertain to the evaluation of patients at risk for COVID-19 are in a state of rapid change based on information released by regulatory bodies including the CDC and federal and state organizations. These policies and algorithms were followed during the patient's care in the ED.  ____________________________________________   FINAL CLINICAL IMPRESSION(S) / ED DIAGNOSES  Accidental heroin overdose   Harvest Dark, MD 11/14/19 1446

## 2019-11-14 NOTE — ED Triage Notes (Signed)
Pt from friends home via ems with reports that pt was found unresponsive by friends, they dumped a bucket of ice on her and gave her 0.8mg  IM Narcan. Pt arrived to ED asleep but responsive to verbal stimuli. Admits to heroin use pta.

## 2019-11-15 NOTE — ED Provider Notes (Signed)
Assumed care from Dr. Erma Heritage at 11 PM.  This is a 24 year old female who presented with an accidental opiate overdose.  Received Narcan.  Patient is arousable to name calling, alert and oriented x3.  Still somnolent and intoxicated.  Has had stable vital signs.  We will continue to monitor until clinically sober for discharge.  _________________________ 6:34 AM on 11/15/2019 -----------------------------------------  Patient awake, eating. Will call for a sober ride home.  Discussed risks associated with narcotic abuse.  Counseling provided.  Discussed my standard return precautions.   Don Perking, [place], MD 11/15/19 [phone]

## 2019-11-15 NOTE — ED Notes (Signed)
Pt appears to be asleep but has cup of soda at mouth. Pt was easily to awake. Cup was placed on counter. Pt has no needs at this time.

## 2020-03-03 IMAGING — CR CHEST - 2 VIEW
1 series · 2 of 2 positions shown · non-contrast
Comparison: None.

CLINICAL DATA: 23-year-old female with overdose.

EXAM:
CHEST - 2 VIEW

[Series 1: dg chest 2 view · 0.14mm/px · 2 of 2 slices shown]
[im 1/2]
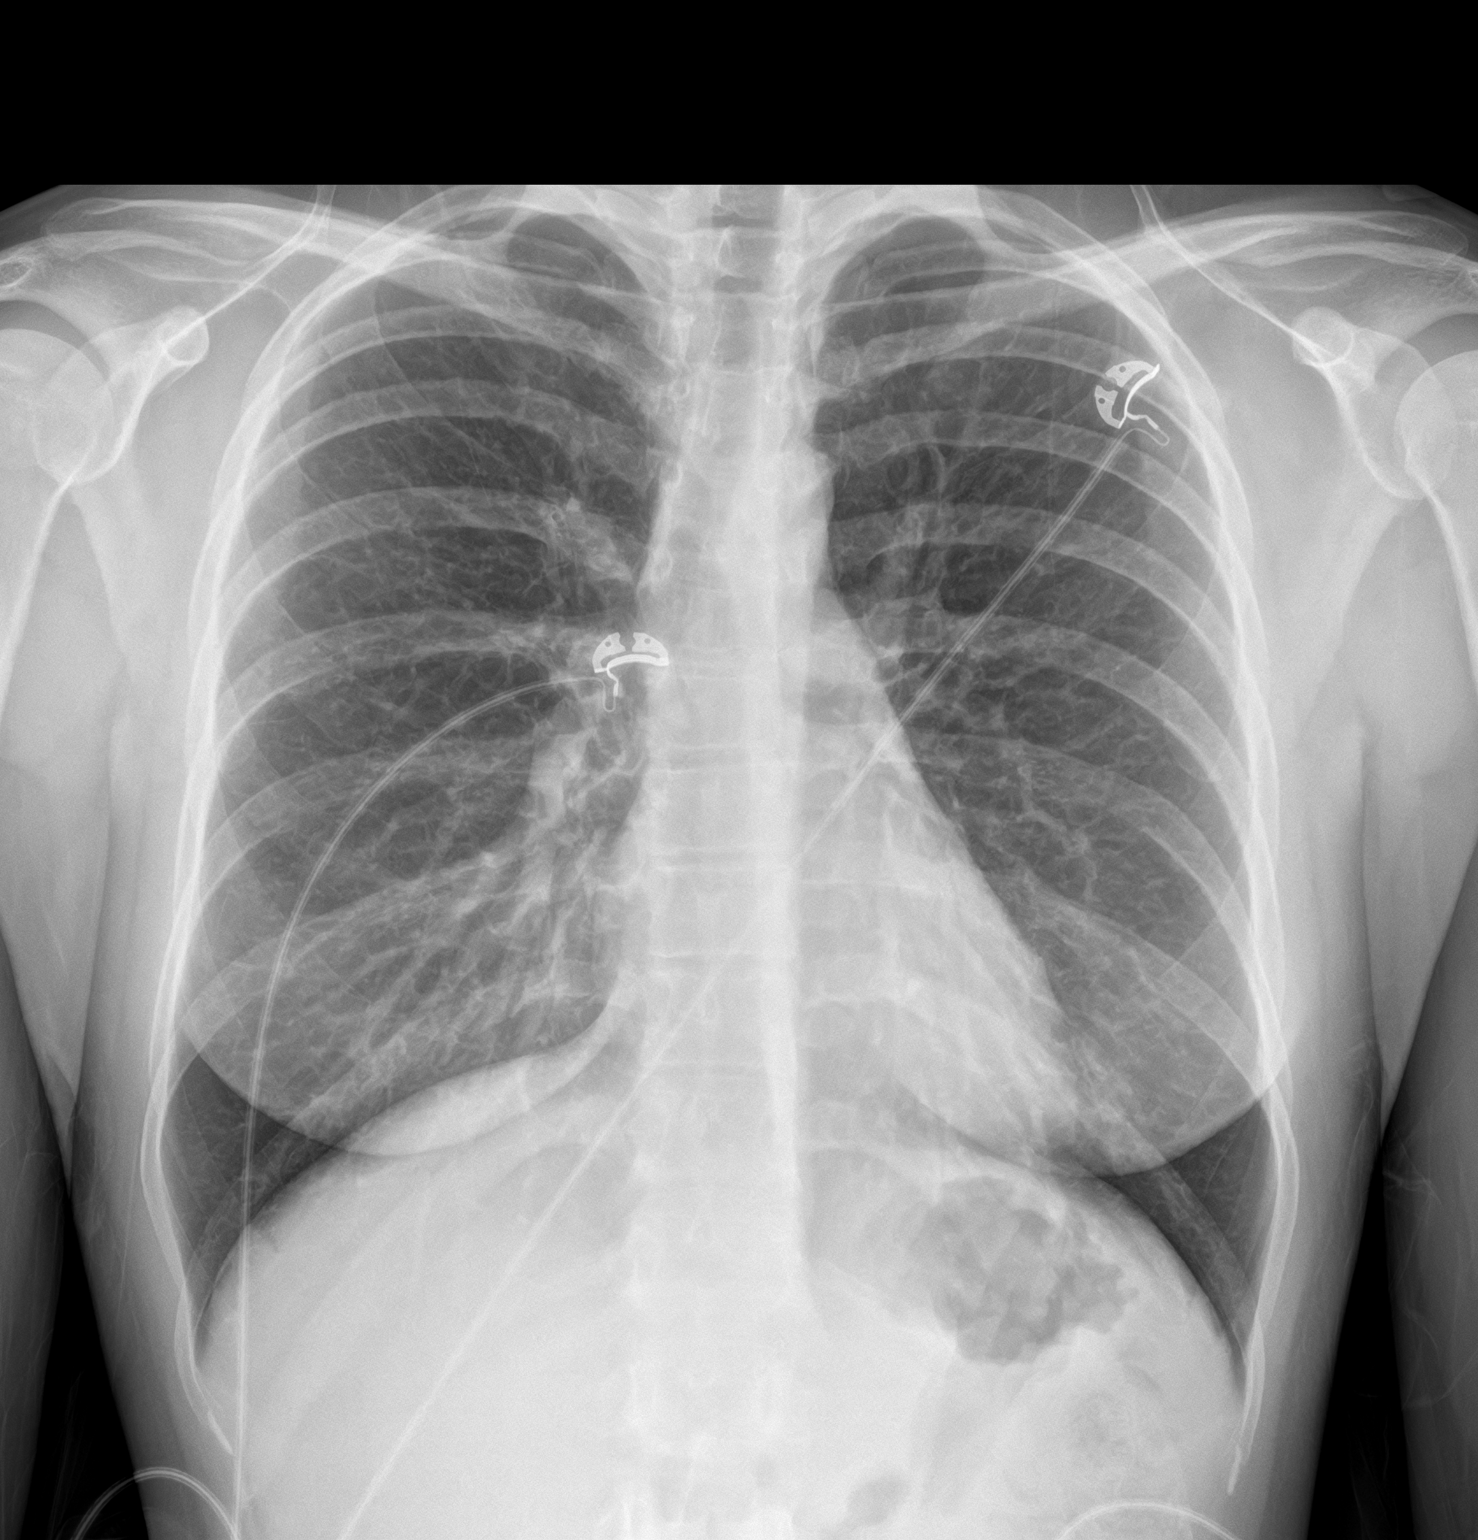
[im 2/2]
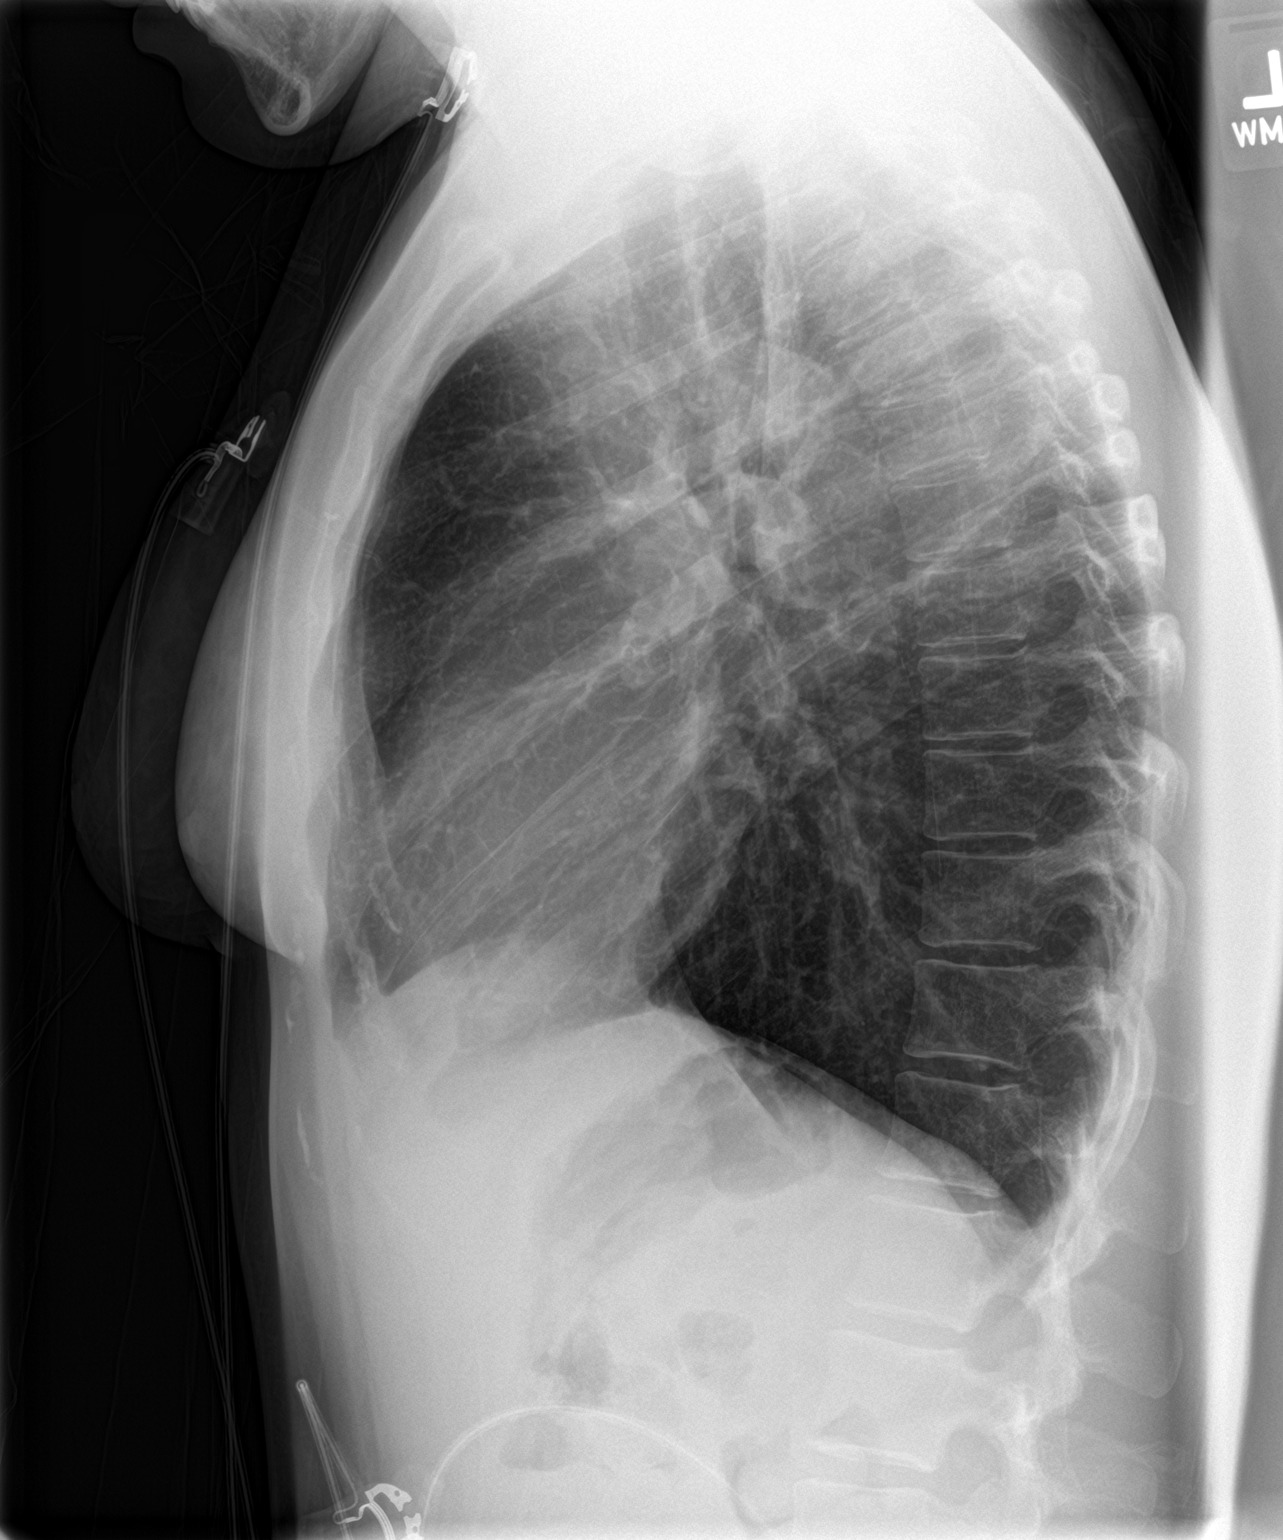

[2 of 2 positions shown; findings below may reference images not displayed]

FINDINGS: The heart size and mediastinal contours are within normal limits.
Both lungs are clear. The visualized skeletal structures are
unremarkable.
IMPRESSION: No active cardiopulmonary disease.

## 2023-08-28 DIAGNOSIS — H5213 Myopia, bilateral: Secondary | ICD-10-CM | POA: Diagnosis not present

## 2024-04-03 ENCOUNTER — Ambulatory Visit (INDEPENDENT_AMBULATORY_CARE_PROVIDER_SITE_OTHER)

## 2024-04-03 ENCOUNTER — Ambulatory Visit: Payer: Self-pay | Admitting: Family Medicine

## 2024-04-03 ENCOUNTER — Ambulatory Visit
Admission: EM | Admit: 2024-04-03 | Discharge: 2024-04-03 | Disposition: A | Discharge status: Home / Self Care | Attending: Family Medicine | Admitting: Family Medicine

## 2024-04-03 DIAGNOSIS — M79645 Pain in left finger(s): Secondary | ICD-10-CM | POA: Diagnosis not present

## 2024-04-03 DIAGNOSIS — S62623A Displaced fracture of medial phalanx of left middle finger, initial encounter for closed fracture: Secondary | ICD-10-CM

## 2024-04-03 DIAGNOSIS — L709 Acne, unspecified: Secondary | ICD-10-CM | POA: Insufficient documentation

## 2024-04-03 MED ORDER — IBUPROFEN 800 MG PO TABS: 800.0000 mg | ORAL_TABLET | Freq: Three times a day (TID) | ORAL | 0 refills | Status: AC

## 2024-04-03 NOTE — ED Triage Notes (Signed)
 Pt c/o L middle finger pain x2 days. States was doing heavy lifting at home & injured it. Has tried IBU w/o relief.

## 2024-04-03 NOTE — Discharge Instructions (Signed)
 You broke your finger.  You have a condition requiring follow up with an orthopedic specialist. Call one of the following offices to schedule an appointment:   [facility] Address: [address] [place], [place] [address] Phone: [phone]  [facility] [address] [place], [place], [place] [address] Phone: [phone]  [facility] [address] [place],  [place]  [address] Phone: [phone]  [facility] [address] [place], [place] [address] Phone: [phone]

## 2024-04-03 NOTE — ED Provider Notes (Signed)
 [facility]    CSN: [id number] Arrival date & time: 04/03/24  1614      History   Chief Complaint Chief Complaint  Patient presents with   Finger Injury    HPI  HPI Crystal Ellis is a 28 y.o. female.   Crystal Ellis presents for left middle finger pain that started 2 days ago. While moving some heavy boxes, her finger got caught and she heard a pop.  Had some swelling. She put some ice on it and took some ibuprofen . No previous hand injury. She thought the finger was sprained but noticed yesterday and it felt like it was crooked.    She is right handed.      History reviewed. No pertinent past medical history.  Patient Active Problem List   Diagnosis Date Noted   Acne 04/03/2024   Anxiety 12/14/2013   Chronic low back pain 12/14/2013   AR (allergic rhinitis) 07/21/2012    Past Surgical History:  Procedure Laterality Date   CESAREAN SECTION      OB History   No obstetric history on file.      Home Medications    Prior to Admission medications   Medication Sig Start Date End Date Taking? Authorizing Provider  loratadine (CLARITIN) 10 MG tablet Take 10 mg by mouth daily as needed for allergies.    [provider]  Naloxone  HCl 0.4 MG/0.4ML SOAJ Please use as needed for accidental opiate overdose. 11/14/19   Dorothyann Drivers, MD    Family History History reviewed. No pertinent family history.  Social History Social History   Tobacco Use   Smoking status: Every Day    Current packs/day: 0.50    Types: Cigarettes   Smokeless tobacco: Never   Tobacco comments:    occasional  Substance Use Topics   Alcohol use: No   Drug use: Yes    Types: IV, Methamphetamines     Allergies   Lavender oil   Review of Systems Review of Systems: :negative unless otherwise stated in HPI.      Physical Exam Triage Vital Signs ED Triage Vitals  Encounter Vitals Group     BP 04/03/24 1630 (!) 134/91     Girls Systolic BP Percentile --       Girls Diastolic BP Percentile --      Boys Systolic BP Percentile --      Boys Diastolic BP Percentile --      Pulse Rate 04/03/24 1630 (!) 110     Resp 04/03/24 1630 16     Temp 04/03/24 1630 98.9 F (37.2 C)     Temp Source 04/03/24 1630 Oral     SpO2 04/03/24 1630 96 %     Weight 04/03/24 1630 145 lb 8 oz (66 kg)     Height --      Head Circumference --      Peak Flow --      Pain Score 04/03/24 1633 6     Pain Loc --      Pain Education --      Exclude from Growth Chart --    No data found.  Updated Vital Signs BP (!) 134/91 (BP Location: Right Arm)   Pulse (!) 110   Temp 98.9 F (37.2 C) (Oral)   Resp 16   Wt 66 kg   LMP 02/25/2024 (Approximate)   SpO2 96%   BMI 26.61 kg/m   Visual Acuity Right Eye Distance:   Left Eye Distance:  Bilateral Distance:    Right Eye Near:   Left Eye Near:    Bilateral Near:     Physical Exam GEN: well appearing female in no acute distress  CVS: well perfused  RESP: speaking in full sentences without pause, no respiratory distress  MSK:   *** shoulder:  No evidence of bony deformity, asymmetry, or muscle atrophy. No tenderness over long head of biceps (bicipital groove).  No TTP at [facility] joint.  Full active and passive (ABD, ADD, Flexion, extension, IR, ER). Limited *** 2/2 to pain.  Strength 5/5 grip, elbow and shoulder. No abnormal scapular function observed.  Special Tests: Vonzell: ***; Empty Can: ***, Neer's: Negative; Painful arc: Negative; Anterior Apprehension: Negative Sensation intact. Peripheral pulses intact.   UC Treatments / Results  Labs (all labs ordered are listed, but only abnormal results are displayed) Labs Reviewed - No data to display  EKG   Radiology No results found.   Procedures Procedures (including critical care time)  Medications Ordered in UC Medications - No data to display  Initial Impression / Assessment and Plan / UC Course  I have reviewed the triage vital signs and the  nursing notes.  Pertinent labs & imaging results that were available during my care of the patient were reviewed by me and considered in my medical decision making (see chart for details).      Pt is a 28 y.o.  female with *** days of *** shoulder pain after ***.   On exam, pt has tenderness at *** concerning for ***.   Obtained *** shoulder plain films.  Personally interpreted by me were ***unremarkable for fracture or dislocation. Radiologist report reviewed and additionally notes *** no soft tissue swelling.  Given ***Toradol IM/sling/brace/crutches  Patient to gradually return to normal activities, as tolerated and continue ordinary activities within the limits permitted by pain. Prescribed Naproxen sodium *** and muscle relaxer *** for pain relief.  Tylenol  PRN. Advised patient to avoid OTC NSAIDs while taking prescription NSAID. Counseled patient on red flag symptoms and when to seek immediate care.  ***No red flags such as progressive major motor weakness.   Patient to follow up with orthopedic provider, if symptoms do not improve with conservative treatment.  Return and ED precautions given. Understanding voiced. Discussed MDM, treatment plan and plan for follow-up with patient/parent who agrees with plan.   Final Clinical Impressions(s) / UC Diagnoses   Final diagnoses:  None   Discharge Instructions   None    ED Prescriptions   None    PDMP not reviewed this encounter.
